# Patient Record
Sex: Male | Born: 1995 | Race: White | Hispanic: No | Marital: Single | Smoking: Never smoker
Health system: Southern US, Community
[De-identification: ages and names within clinical notes are randomized; demographics above are authoritative.]

## PROBLEM LIST (undated history)

## (undated) DIAGNOSIS — F329 Major depressive disorder, single episode, unspecified: Secondary | ICD-10-CM

## (undated) DIAGNOSIS — J45909 Unspecified asthma, uncomplicated: Secondary | ICD-10-CM

## (undated) DIAGNOSIS — R011 Cardiac murmur, unspecified: Secondary | ICD-10-CM

## (undated) DIAGNOSIS — F419 Anxiety disorder, unspecified: Secondary | ICD-10-CM

## (undated) DIAGNOSIS — F32A Depression, unspecified: Secondary | ICD-10-CM

## (undated) DIAGNOSIS — I38 Endocarditis, valve unspecified: Secondary | ICD-10-CM

## (undated) HISTORY — PX: OTHER SURGICAL HISTORY: SHX169

## (undated) HISTORY — DX: Anxiety disorder, unspecified: F41.9

## (undated) HISTORY — DX: Depression, unspecified: F32.A

## (undated) HISTORY — DX: Unspecified asthma, uncomplicated: J45.909

## (undated) HISTORY — DX: Major depressive disorder, single episode, unspecified: F32.9

---

## 2017-10-08 NOTE — Progress Notes (Signed)
10/09/2017 11:30 AM   Antonio KungAustin Warburton [birth year] [id number]  Referring provider: Armando GangLindley, Cheryl P, FNP [address] [place], [place] [id number]  Chief Complaint  Patient presents with  . Testicle Pain    HPI: Patient is a 22 year old Caucasian male who is referred by Armando Gangheryl P Lindley, FNP for a testicular lump and groin pain.  He has had a small hard lump on his left scrotum that he discovered in January.  It was not painful upon first discovery, but it is now painful.  He is having night sweats, 10 to 15 lbs weight loss in the last year or two.    He also states that intercourse can become painful at times.  He denies any pain with erections or curvature with erections.    He denies any trauma to the scrotum or penis.    He has been screened for DM and thyroid disease and they were normal.  His UA positive for moderate bacteria.    Scrotal ultrasound performed at PCP's office on 09/18/2017 noted no tumor, hydrocele or varicocele.    PMH: Past Medical History:  Diagnosis Date  . Anxiety   . Asthma   . Depression     Surgical History: Past Surgical History:  Procedure Laterality Date  . right ring finger      Home Medications:  Allergies as of 10/09/2017   No Known Allergies     Medication List    as of 10/09/2017 11:30 AM   You have not been prescribed any medications.     Allergies: No Known Allergies  Family History: Family History  Problem Relation Age of Onset  . Prostate cancer Paternal Grandfather     Social History:  reports that he has been smoking e-cigarettes. He has never used smokeless tobacco. His alcohol and drug histories are not on file.  ROS: UROLOGY Frequent Urination?: No Hard to postpone urination?: No Burning/pain with urination?: No Get up at night to urinate?: No Leakage of urine?: No Urine stream starts and stops?: No Trouble starting stream?: No Do you have to strain to urinate?: No Blood in urine?: No Urinary  tract infection?: No Sexually transmitted disease?: No Injury to kidneys or bladder?: No Painful intercourse?: Yes Weak stream?: No Erection problems?: No Penile pain?: Yes  Gastrointestinal Nausea?: Yes Vomiting?: No Indigestion/heartburn?: No Diarrhea?: Yes Constipation?: No  Constitutional Fever: No Night sweats?: Yes Weight loss?: Yes Fatigue?: Yes  Skin Skin rash/lesions?: No Itching?: No  Eyes Blurred vision?: No Double vision?: No  Ears/Nose/Throat Sore throat?: No Sinus problems?: No  Hematologic/Lymphatic Swollen glands?: No Easy bruising?: No  Cardiovascular Leg swelling?: No Chest pain?: No  Respiratory Cough?: No Shortness of breath?: No  Endocrine Excessive thirst?: No  Musculoskeletal Back pain?: No Joint pain?: No  Neurological Headaches?: No Dizziness?: No  Psychologic Depression?: No Anxiety?: Yes  Physical Exam: BP 120/78   Pulse 70   Ht 5\' 7"  (1.702 m)   Wt 155 lb 12.8 oz (70.7 kg)   BMI 24.40 kg/m   Constitutional:  Well nourished. Alert and oriented, No acute distress. HEENT: [place] AT, moist mucus membranes.  Trachea midline, no masses. Cardiovascular: No clubbing, cyanosis, or edema. Respiratory: Normal respiratory effort, no increased work of breathing. GI: Abdomen is soft, non tender, non distended, no abdominal masses. Liver and spleen not palpable.  No hernias appreciated.  Stool sample for occult testing is not indicated.   GU: No CVA tenderness.  No bladder fullness or masses.  Patient with circumcised phallus.  Urethral meatus is patent.  No penile discharge. No penile lesions or rashes. Scrotum without lesions, cysts, rashes and/or edema.  Testicles are located scrotally bilaterally. No masses are appreciated in the testicles. Left and right epididymis are normal.  The area of concern is located on the left in the upper scrotal cord.  It is 3 mm x 5 mm in size.   Rectal: Not performed.  Skin: No rashes, bruises or  suspicious lesions. Lymph: No cervical or inguinal adenopathy. Neurologic: Grossly intact, no focal deficits, moving all 4 extremities. Psychiatric: Normal mood and affect.  Laboratory Data: No results found for: WBC, HGB, HCT, MCV, PLT  No results found for: CREATININE  No results found for: PSA  No results found for: TESTOSTERONE  No results found for: HGBA1C  No results found for: TSH  No results found for: CHOL, HDL, CHOLHDL, VLDL, LDLCALC  No results found for: AST No results found for: ALT No components found for: ALKALINEPHOPHATASE No components found for: BILIRUBINTOTAL  No results found for: ESTRADIOL  Urinalysis No results found for: COLORURINE, APPEARANCEUR, LABSPEC, PHURINE, GLUCOSEU, HGBUR, BILIRUBINUR, KETONESUR, PROTEINUR, UROBILINOGEN, NITRITE, LEUKOCYTESUR  I have reviewed the labs.  Assessment & Plan:   1. Scrotal lump Patient will have a repeat scrotal ultrasound for further evaluation of the scrotal cord so that we may view the images on [facility] as the images cannot be reviewed from the PCP's office RTC for scrotal ultrasound report.    2. Weight loss Explained to the patient that the area in the scrotum is not likely the cause of his weight loss, advised to follow up with PCP  3. Night sweats See above   Return for scrotal ultrasound report .  These notes generated with voice recognition software. I apologize for typographical errors.  Michiel CowboySHANNON Jacobus Colvin, PA-C  [facility] [address]  [address] [place], [place] [address] [phone]

## 2017-10-09 ENCOUNTER — Encounter: Payer: Self-pay | Admitting: Urology

## 2017-10-09 ENCOUNTER — Ambulatory Visit (INDEPENDENT_AMBULATORY_CARE_PROVIDER_SITE_OTHER): Payer: 59 | Admitting: Urology

## 2017-10-09 ENCOUNTER — Other Ambulatory Visit: Payer: Self-pay

## 2017-10-09 VITALS — BP 120/78 | HR 70 | Ht 67.0 in | Wt 155.8 lb

## 2017-10-09 DIAGNOSIS — R61 Generalized hyperhidrosis: Secondary | ICD-10-CM | POA: Diagnosis not present

## 2017-10-09 DIAGNOSIS — R634 Abnormal weight loss: Secondary | ICD-10-CM

## 2017-10-09 DIAGNOSIS — N509 Disorder of male genital organs, unspecified: Secondary | ICD-10-CM | POA: Diagnosis not present

## 2017-10-09 DIAGNOSIS — N5089 Other specified disorders of the male genital organs: Secondary | ICD-10-CM

## 2017-10-09 LAB — MICROSCOPIC EXAMINATION
EPITHELIAL CELLS (NON RENAL): NONE SEEN /HPF (ref 0–10)
WBC, UA: NONE SEEN /hpf (ref 0–5)

## 2017-10-09 LAB — URINALYSIS, COMPLETE
BILIRUBIN UA: NEGATIVE
Glucose, UA: NEGATIVE
Ketones, UA: NEGATIVE
Leukocytes, UA: NEGATIVE
Nitrite, UA: NEGATIVE
PH UA: 6 (ref 5.0–7.5)
Specific Gravity, UA: >1.03 — ABNORMAL HIGH (ref 1.005–1.030)
Urobilinogen, Ur: 0.2 mg/dL (ref 0.2–1.0)

## 2017-10-11 LAB — CULTURE, URINE COMPREHENSIVE

## 2017-10-17 ENCOUNTER — Ambulatory Visit
Admission: RE | Admit: 2017-10-17 | Discharge: 2017-10-17 | Disposition: A | Discharge status: Home / Self Care | Payer: 59 | Source: Ambulatory Visit | Attending: Urology | Admitting: Urology

## 2017-10-17 DIAGNOSIS — N509 Disorder of male genital organs, unspecified: Secondary | ICD-10-CM | POA: Insufficient documentation

## 2017-10-17 DIAGNOSIS — N5089 Other specified disorders of the male genital organs: Secondary | ICD-10-CM

## 2017-10-23 ENCOUNTER — Telehealth: Payer: Self-pay | Admitting: Urology

## 2017-10-23 NOTE — Telephone Encounter (Signed)
Pt lmom stating that he had US last week and was told someone would call him with results last Friday, pt stated he hasn't received call from anyone, asking for someone to call with results. Please advise. Thanks.

## 2017-10-23 NOTE — Telephone Encounter (Signed)
Pt has appt scheduled for 10/30/17 to discuss u/s results

## 2017-10-30 ENCOUNTER — Ambulatory Visit (INDEPENDENT_AMBULATORY_CARE_PROVIDER_SITE_OTHER): Payer: 59 | Admitting: Urology

## 2017-10-30 ENCOUNTER — Other Ambulatory Visit: Payer: Self-pay

## 2017-10-30 ENCOUNTER — Encounter: Payer: Self-pay | Admitting: Urology

## 2017-10-30 VITALS — BP 123/67 | HR 91 | Ht 67.0 in | Wt 155.0 lb

## 2017-10-30 DIAGNOSIS — N361 Urethral diverticulum: Secondary | ICD-10-CM

## 2017-10-30 MED ORDER — DIAZEPAM 10 MG PO TABS: ORAL_TABLET | ORAL | 0 refills | Status: DC

## 2017-10-30 NOTE — Progress Notes (Signed)
10/30/2017 5:08 PM   Antonio Hayes 1995/08/10 [id number]  Referring provider: No referring provider defined for this encounter.  No chief complaint on file.   HPI: Patient is a 22 year old Caucasian male with a history of a testicular lump and groin pain who presents today for scrotal ultrasound results.  Background history Patient is a 22 year old Caucasian male who is referred by [name], FNP for a testicular lump and groin pain.  He has had a small hard lump on his left scrotum that he discovered in January.  It was not painful upon first discovery, but it is now painful.  He is having night sweats, 10 to 15 lbs weight loss in the last year or two.  He also states that intercourse can become painful at times.  He denies any pain with erections or curvature with erections.  He denies any trauma to the scrotum or penis.  He has been screened for DM and thyroid disease and they were normal.  His UA positive for moderate bacteria.   Scrotal ultrasound performed at PCP's office on 09/18/2017 noted no tumor, hydrocele or varicocele.    Since his scrotal ultrasound was performed at the PCPs office, I did not have access to the imaging so a repeat ultrasound was performed.  Scrotal ultrasound on 10/17/2017 revealed negative for testicular torsion or intratesticular mass lesion.  No cystic or solid mass to correspond to palpable, painful area located at the left lateral base of penis.  He is still having discomfort during sexual intercourse.  He is not having any urinary symptoms, specifically no spraying of the stream or straining to urinate.  Patient denies any gross hematuria, dysuria or suprapubic/flank pain.  Patient denies any fevers, chills, nausea or vomiting.     PMH: Past Medical History:  Diagnosis Date  . Anxiety   . Asthma   . Depression     Surgical History: Past Surgical History:  Procedure Laterality Date  . right ring finger      Home Medications:    Allergies as of 10/30/2017   No Known Allergies     Medication List        Accurate as of 10/30/17  5:08 PM. Always use your most recent med list.          diazepam 10 MG tablet Commonly known as:  VALIUM Take one tablet 30 minutes prior to the cystoscopy       Allergies: No Known Allergies  Family History: Family History  Problem Relation Age of Onset  . Prostate cancer Paternal Grandfather     Social History:  reports that he has been smoking e-cigarettes. He has never used smokeless tobacco. His alcohol and drug histories are not on file.  ROS: UROLOGY Frequent Urination?: No Hard to postpone urination?: No Burning/pain with urination?: No Get up at night to urinate?: No Leakage of urine?: No Urine stream starts and stops?: No Trouble starting stream?: No Do you have to strain to urinate?: No Blood in urine?: No Urinary tract infection?: No Sexually transmitted disease?: No Injury to kidneys or bladder?: No Painful intercourse?: Yes Weak stream?: No Erection problems?: No Penile pain?: No  Gastrointestinal Nausea?: Yes Vomiting?: No Indigestion/heartburn?: No Diarrhea?: Yes Constipation?: No  Constitutional Fever: No Night sweats?: No Weight loss?: No Fatigue?: No  Skin Skin rash/lesions?: No Itching?: No  Eyes Blurred vision?: No Double vision?: No  Ears/Nose/Throat Sore throat?: No Sinus problems?: No  Hematologic/Lymphatic Swollen glands?: No Easy bruising?:  No  Cardiovascular Leg swelling?: No Chest pain?: No  Respiratory Cough?: No Shortness of breath?: No  Endocrine Excessive thirst?: No  Musculoskeletal Back pain?: No Joint pain?: No  Neurological Headaches?: No Dizziness?: No  Psychologic Depression?: Yes Anxiety?: Yes  Physical Exam: BP 123/67   Pulse 91   Ht 5\' 7"  (1.702 m)   Wt 155 lb (70.3 kg)   BMI 24.28 kg/m   Constitutional:  Well nourished. Alert and oriented, No acute distress. HEENT: [place] AT,  moist mucus membranes.  Trachea midline, no masses. Cardiovascular: No clubbing, cyanosis, or edema. Respiratory: Normal respiratory effort, no increased work of breathing. GI: Abdomen is soft, non tender, non distended, no abdominal masses. Liver and spleen not palpable.  No hernias appreciated.  Stool sample for occult testing is not indicated.   GU: No CVA tenderness.  No bladder fullness or masses.  Patient with circumcised phallus.  Urethral meatus is patent.  No penile discharge. No penile lesions or rashes. Scrotum without lesions, cysts, rashes and/or edema.  Testicles are located scrotally bilaterally. No masses are appreciated in the testicles. Left and right epididymis are normal.  The area of concern is located on the ventral side left in the upper scrotal cord.  It is 5 mm x 7 mm in size, mobile but attached and polypoid in nature.   Rectal: Not performed.  Skin: No rashes, bruises or suspicious lesions. Lymph: No cervical or inguinal adenopathy. Neurologic: Grossly intact, no focal deficits, moving all 4 extremities. Psychiatric: Normal mood and affect.  Laboratory Data: No results found for: WBC, HGB, HCT, MCV, PLT  No results found for: CREATININE  No results found for: PSA  No results found for: TESTOSTERONE  No results found for: HGBA1C  No results found for: TSH  No results found for: CHOL, HDL, CHOLHDL, VLDL, LDLCALC  No results found for: AST No results found for: ALT No components found for: ALKALINEPHOPHATASE No components found for: BILIRUBINTOTAL  No results found for: ESTRADIOL  Urinalysis    Component Value Date/Time   APPEARANCEUR Clear 10/09/2017 1113   GLUCOSEU Negative 10/09/2017 1113   BILIRUBINUR Negative 10/09/2017 1113   PROTEINUR Trace (A) 10/09/2017 1113   NITRITE Negative 10/09/2017 1113   LEUKOCYTESUR Negative 10/09/2017 1113    I have reviewed the labs.  Pertinent imaging CLINICAL DATA:  Left testicle lump  EXAM: SCROTAL  ULTRASOUND  DOPPLER ULTRASOUND OF THE TESTICLES  TECHNIQUE: Complete ultrasound examination of the testicles, epididymis, and other scrotal structures was performed. Color and spectral Doppler ultrasound were also utilized to evaluate blood flow to the testicles.  COMPARISON:  None.  FINDINGS: Right testicle  Measurements: 5.7 x 2.5 x 3.7 cm. No mass or microlithiasis visualized.  Left testicle  Measurements: 5.4 x 2.1 x 3.5 cm. No mass or microlithiasis visualized.  Right epididymis:  Normal in size and appearance.  Left epididymis:  Normal in size and appearance.  Hydrocele:  None visualized.  Varicocele:  None visualized.  Pulsed Doppler interrogation of both testes demonstrates normal low resistance arterial and venous waveforms bilaterally.  IMPRESSION: 1. Negative for testicular torsion or intratesticular mass lesion. 2. No cystic or solid mass to correspond to palpable, painful area located at the left lateral base of penis.   Electronically Signed   By: Jasmine Pang M.D.   On: 10/18/2017 02:48  I have independently reviewed the films  Assessment & Plan:   1. ?Urethral diverticulum Patient examined with Dr. Apolinar Junes and Dr. Richardo Hanks.  Differential diagnose  of urethral diverticulum vs polypoid lesion.  He will be schedule for a cystoscopy and if the area cannot be identified, he will be scheduled for a biopsy of the lesion.  I have explained to the patient that they will  be scheduled for a cystoscopy in our office to evaluate their bladder.  The cystoscopy consists of passing a tube with a lens up through their urethra and into their urinary bladder.   We will inject the urethra with a lidocaine gel prior to introducing the cystoscope to help with any discomfort during the procedure.   After the procedure, they might experience blood in the urine and discomfort with urination.  This will abate after the first few voids.  I have  encouraged the  patient to increase water intake  during this time.  Patient denies any allergies to lidocaine.  He is given Valium to take prior to his procedure.  He is instructed to bring a responsible adult to drive him and to help him remember what will be dicussed after the cystoscopy.     Return for Cystoscopy with Dr. Apolinar Junes or Dr. Richardo Hanks .  These notes generated with voice recognition software. I apologize for typographical errors.  Michiel Cowboy, PA-C  [facility] [address]  [address] [place], [place] [address] [phone]

## 2017-11-06 ENCOUNTER — Other Ambulatory Visit: Payer: 59 | Admitting: Urology

## 2017-11-19 ENCOUNTER — Encounter: Payer: Self-pay | Admitting: Urology

## 2017-11-19 ENCOUNTER — Ambulatory Visit (INDEPENDENT_AMBULATORY_CARE_PROVIDER_SITE_OTHER): Payer: 59 | Admitting: Urology

## 2017-11-19 VITALS — BP 128/76 | HR 81 | Ht 67.0 in | Wt 155.4 lb

## 2017-11-19 DIAGNOSIS — N361 Urethral diverticulum: Secondary | ICD-10-CM

## 2017-11-19 NOTE — Progress Notes (Signed)
Cystoscopy Procedure Note:  Indication: Possible urethral lesion/diverticulum  On exam, I'm able to palpate a small ~205mm spherical structure just left of midline at ventral aspect of the peno-scrotal junction. This is freely mobile, and mildly tender.  After informed consent and discussion of the procedure and its risks, Antonio Hayes was positioned and prepped in the standard fashion. Cystoscopy was performed with a flexible cystoscope. The urethra was normal throughout with no ventral os or abnormality. The prostate was short. Cystoscopy grossly normal with no concerning mucosal lesions.  Findings: Normal cystoscopy  Assessment and Plan: Anticipate scheduling for scrotal exploration and excision of lesion with Dr. Apolinar Hayes in the OR  Antonio RamsBrian Sninsky, MD 11/19/2017

## 2017-11-20 LAB — URINALYSIS, COMPLETE
Bilirubin, UA: NEGATIVE
Glucose, UA: NEGATIVE
Ketones, UA: NEGATIVE
Leukocytes, UA: NEGATIVE
Nitrite, UA: NEGATIVE
PH UA: 6 (ref 5.0–7.5)
PROTEIN UA: NEGATIVE
Specific Gravity, UA: 1.02 (ref 1.005–1.030)
UUROB: 0.2 mg/dL (ref 0.2–1.0)

## 2017-11-20 LAB — MICROSCOPIC EXAMINATION
BACTERIA UA: NONE SEEN
WBC UA: NONE SEEN /HPF (ref 0–5)

## 2017-11-22 ENCOUNTER — Telehealth: Payer: Self-pay

## 2017-11-22 NOTE — Telephone Encounter (Signed)
Patient notified that surgery has been scheduled for 12-14-17. Patient states he needs to change this due to a scheduling conflict. Surgery was rescheduled to 12-28-17. Will call with PAT apt

## 2017-11-27 ENCOUNTER — Other Ambulatory Visit: Payer: Self-pay | Admitting: Radiology

## 2017-11-27 DIAGNOSIS — L729 Follicular cyst of the skin and subcutaneous tissue, unspecified: Secondary | ICD-10-CM

## 2017-12-21 ENCOUNTER — Inpatient Hospital Stay: Admission: RE | Admit: 2017-12-21 | Payer: 59 | Source: Ambulatory Visit

## 2017-12-26 ENCOUNTER — Other Ambulatory Visit: Payer: Self-pay

## 2017-12-26 ENCOUNTER — Encounter
Admission: RE | Admit: 2017-12-26 | Discharge: 2017-12-26 | Disposition: A | Discharge status: Home / Self Care | Payer: 59 | Source: Ambulatory Visit | Attending: Urology | Admitting: Urology

## 2017-12-26 HISTORY — DX: Endocarditis, valve unspecified: I38

## 2017-12-26 HISTORY — DX: Cardiac murmur, unspecified: R01.1

## 2017-12-26 NOTE — Patient Instructions (Signed)
Your procedure is scheduled on: 12-28-17  Report to Same Day Surgery 2nd floor medical mall [facility] Entrance-take elevator on left to 2nd floor.  Check in with surgery information desk.) To find out your arrival time please call [phone] between 1PM - 3PM on 12-26-17   Remember: Instructions that are not followed completely may result in serious medical risk, up to and including death, or upon the discretion of your surgeon and anesthesiologist your surgery may need to be rescheduled.    _x___ 1. Do not eat food after midnight the night before your procedure. You may drink clear liquids up to 2 hours before you are scheduled to arrive at the hospital for your procedure.  Do not drink clear liquids within 2 hours of your scheduled arrival to the hospital.  Clear liquids include  --Water or Apple juice without pulp  --Clear carbohydrate beverage such as ClearFast or Gatorade  --Black Coffee or Clear Tea (No milk, no creamers, do not add anything to the coffee or Tea   ____Ensure clear carbohydrate drink on the way to the hospital for bariatric patients  ____Ensure clear carbohydrate drink 3 hours before surgery for Dr Rutherford Nail patients if physician instructed.   No gum chewing or hard candies.     __x__ 2. No Alcohol for 24 hours before or after surgery.   __x__3. No Smoking or e-cigarettes for 24 prior to surgery.  Do not use any chewable tobacco products for at least 6 hour prior to surgery   ____  4. Bring all medications with you on the day of surgery if instructed.    __x__ 5. Notify your doctor if there is any change in your medical condition     (cold, fever, infections).    x___6. On the morning of surgery brush your teeth with toothpaste and water.  You may rinse your mouth with mouth wash if you wish.  Do not swallow any toothpaste or mouthwash.   Do not wear jewelry, make-up, hairpins, clips or nail polish.  Do not wear lotions, powders, or perfumes. You may wear  deodorant.  Do not shave 48 hours prior to surgery. Men may shave face and neck.  Do not bring valuables to the hospital.    [facility] is not responsible for any belongings or valuables.               Contacts, dentures or bridgework may not be worn into surgery.  Leave your suitcase in the car. After surgery it may be brought to your room.  For patients admitted to the hospital, discharge time is determined by your  treatment team.  _  Patients discharged the day of surgery will not be allowed to drive home.  You will need someone to drive you home and stay with you the night of your procedure.    Please read over the following fact sheets that you were given:   [facility] Preparing for Surgery   ____ Take anti-hypertensive listed below, cardiac, seizure, asthma, anti-reflux and psychiatric medicines. These include:  1. NONE  2.  3.  4.  5.  6.  ____Fleets enema or Magnesium Citrate as directed.   ____ Use CHG Soap or sage wipes as directed on instruction sheet   ____ Use inhalers on the day of surgery and bring to hospital day of surgery  ____ Stop Metformin and Janumet 2 days prior to surgery.    ____ Take 1/2 of usual insulin dose the night before surgery  and none on the morning surgery.   ____ Follow recommendations from Cardiologist, Pulmonologist or PCP regarding  stopping Aspirin, Coumadin, Plavix ,Eliquis, Effient, or Pradaxa, and Pletal.  X____Stop Anti-inflammatories such as Advil, Aleve, Ibuprofen, Motrin, Naproxen, Naprosyn, Goodies powders or aspirin products NOW-OK to take Tylenol    ____ Stop supplements until after surgery.   ____ Bring C-Pap to the hospital.

## 2017-12-27 MED ORDER — CEFAZOLIN SODIUM-DEXTROSE 1-4 GM/50ML-% IV SOLN
1.0000 g | INTRAVENOUS | Status: AC
  Administered 2017-12-28: 1 g via INTRAVENOUS

## 2017-12-28 ENCOUNTER — Ambulatory Visit: Payer: Commercial Managed Care - HMO | Admitting: Certified Registered Nurse Anesthetist

## 2017-12-28 ENCOUNTER — Encounter
Admission: RE | Disposition: A | Discharge status: Home / Self Care | Payer: Self-pay | Source: Ambulatory Visit | Attending: Urology

## 2017-12-28 ENCOUNTER — Ambulatory Visit
Admission: RE | Admit: 2017-12-28 | Discharge: 2017-12-28 | Disposition: A | Discharge status: Home / Self Care | Payer: Commercial Managed Care - HMO | Source: Ambulatory Visit | Attending: Urology | Admitting: Urology

## 2017-12-28 ENCOUNTER — Telehealth: Payer: Self-pay | Admitting: Radiology

## 2017-12-28 DIAGNOSIS — Z885 Allergy status to narcotic agent status: Secondary | ICD-10-CM | POA: Insufficient documentation

## 2017-12-28 DIAGNOSIS — L729 Follicular cyst of the skin and subcutaneous tissue, unspecified: Secondary | ICD-10-CM | POA: Insufficient documentation

## 2017-12-28 DIAGNOSIS — J45909 Unspecified asthma, uncomplicated: Secondary | ICD-10-CM | POA: Diagnosis not present

## 2017-12-28 HISTORY — PX: SCROTAL EXPLORATION: SHX2386

## 2017-12-28 HISTORY — PX: URETHRAL CYST REMOVAL: SHX5128

## 2017-12-28 LAB — URINE DRUG SCREEN, QUALITATIVE (ARMC ONLY)
AMPHETAMINES, UR SCREEN: NOT DETECTED
BENZODIAZEPINE, UR SCRN: NOT DETECTED
Barbiturates, Ur Screen: NOT DETECTED
CANNABINOID 50 NG, UR ~~LOC~~: POSITIVE — AB
Cocaine Metabolite,Ur ~~LOC~~: NOT DETECTED
MDMA (Ecstasy)Ur Screen: NOT DETECTED
Methadone Scn, Ur: NOT DETECTED
OPIATE, UR SCREEN: NOT DETECTED
PHENCYCLIDINE (PCP) UR S: NOT DETECTED
Tricyclic, Ur Screen: NOT DETECTED

## 2017-12-28 SURGERY — EXPLORATION, SCROTUM
Anesthesia: General

## 2017-12-28 MED ORDER — PROPOFOL 10 MG/ML IV BOLUS
INTRAVENOUS | Status: DC | PRN
  Administered 2017-12-28: 200 mg via INTRAVENOUS

## 2017-12-28 MED ORDER — OXYCODONE HCL 5 MG PO TABS
ORAL_TABLET | ORAL | Status: AC
  Filled 2017-12-28: qty 1

## 2017-12-28 MED ORDER — ONDANSETRON HCL 4 MG/2ML IJ SOLN
INTRAMUSCULAR | Status: DC | PRN
  Administered 2017-12-28: 4 mg via INTRAVENOUS

## 2017-12-28 MED ORDER — PHENYLEPHRINE HCL 10 MG/ML IJ SOLN
INTRAMUSCULAR | Status: DC | PRN
  Administered 2017-12-28: 100 ug via INTRAVENOUS

## 2017-12-28 MED ORDER — FENTANYL CITRATE (PF) 100 MCG/2ML IJ SOLN
INTRAMUSCULAR | Status: AC
  Filled 2017-12-28: qty 2

## 2017-12-28 MED ORDER — LIDOCAINE HCL (PF) 1 % IJ SOLN
INTRAMUSCULAR | Status: AC
  Filled 2017-12-28: qty 30

## 2017-12-28 MED ORDER — LACTATED RINGERS IV SOLN
INTRAVENOUS | Status: DC
  Administered 2017-12-28 (×2): via INTRAVENOUS

## 2017-12-28 MED ORDER — MIDAZOLAM HCL 2 MG/2ML IJ SOLN
INTRAMUSCULAR | Status: AC
  Filled 2017-12-28: qty 2

## 2017-12-28 MED ORDER — ACETAMINOPHEN 500 MG PO TABS: 1000.0000 mg | ORAL_TABLET | Freq: Four times a day (QID) | ORAL | 2 refills | Status: AC | PRN

## 2017-12-28 MED ORDER — BUPIVACAINE HCL (PF) 0.5 % IJ SOLN
INTRAMUSCULAR | Status: AC
  Filled 2017-12-28: qty 30

## 2017-12-28 MED ORDER — FENTANYL CITRATE (PF) 100 MCG/2ML IJ SOLN
INTRAMUSCULAR | Status: DC | PRN
  Administered 2017-12-28: 100 ug via INTRAVENOUS

## 2017-12-28 MED ORDER — DEXAMETHASONE SODIUM PHOSPHATE 10 MG/ML IJ SOLN
INTRAMUSCULAR | Status: DC | PRN
  Administered 2017-12-28: 10 mg via INTRAVENOUS

## 2017-12-28 MED ORDER — PROPOFOL 10 MG/ML IV BOLUS
INTRAVENOUS | Status: AC
  Filled 2017-12-28: qty 20

## 2017-12-28 MED ORDER — HYDROMORPHONE HCL 1 MG/ML IJ SOLN: 0.2500 mg | INTRAMUSCULAR | Status: DC | PRN

## 2017-12-28 MED ORDER — FAMOTIDINE 20 MG PO TABS
20.0000 mg | ORAL_TABLET | Freq: Once | ORAL | Status: AC
  Administered 2017-12-28: 20 mg via ORAL

## 2017-12-28 MED ORDER — OXYCODONE HCL 5 MG PO TABS: 5.0000 mg | ORAL_TABLET | Freq: Three times a day (TID) | ORAL | 0 refills | Status: AC | PRN

## 2017-12-28 MED ORDER — MIDAZOLAM HCL 2 MG/2ML IJ SOLN
INTRAMUSCULAR | Status: DC | PRN
  Administered 2017-12-28: 2 mg via INTRAVENOUS

## 2017-12-28 MED ORDER — BACITRACIN ZINC 500 UNIT/GM EX OINT
TOPICAL_OINTMENT | CUTANEOUS | Status: AC
  Filled 2017-12-28: qty 28.35

## 2017-12-28 MED ORDER — FAMOTIDINE 20 MG PO TABS
ORAL_TABLET | ORAL | Status: AC
  Filled 2017-12-28: qty 1

## 2017-12-28 MED ORDER — BUPIVACAINE HCL 0.5 % IJ SOLN
INTRAMUSCULAR | Status: DC | PRN
  Administered 2017-12-28: 8 mL

## 2017-12-28 MED ORDER — CEFAZOLIN SODIUM-DEXTROSE 1-4 GM/50ML-% IV SOLN
INTRAVENOUS | Status: AC
  Filled 2017-12-28: qty 50

## 2017-12-28 MED ORDER — OXYCODONE HCL 5 MG PO TABS
5.0000 mg | ORAL_TABLET | Freq: Once | ORAL | Status: AC
  Administered 2017-12-28: 5 mg via ORAL

## 2017-12-28 SURGICAL SUPPLY — 37 items
BLADE CLIPPER SURG (BLADE) ×2 IMPLANT
BLADE SURG 15 STRL LF DISP TIS (BLADE) ×1 IMPLANT
BLADE SURG 15 STRL SS (BLADE) ×1
CANISTER SUCT 1200ML W/VALVE (MISCELLANEOUS) ×2 IMPLANT
CHLORAPREP W/TINT 26ML (MISCELLANEOUS) ×2 IMPLANT
COVER WAND RF STERILE (DRAPES) ×2 IMPLANT
DERMABOND ADVANCED (GAUZE/BANDAGES/DRESSINGS) ×1
DERMABOND ADVANCED .7 DNX12 (GAUZE/BANDAGES/DRESSINGS) ×1 IMPLANT
DRAIN PENROSE 1/4X12 LTX (DRAIN) IMPLANT
DRAPE LAPAROTOMY 77X122 PED (DRAPES) ×2 IMPLANT
DRSG GAUZE FLUFF 36X18 (GAUZE/BANDAGES/DRESSINGS) ×2 IMPLANT
ELECT REM PT RETURN 9FT ADLT (ELECTROSURGICAL) ×2
ELECTRODE REM PT RTRN 9FT ADLT (ELECTROSURGICAL) ×1 IMPLANT
GAUZE SPONGE 4X4 12PLY STRL (GAUZE/BANDAGES/DRESSINGS) IMPLANT
GLOVE BIOGEL PI IND STRL 7.5 (GLOVE) ×1 IMPLANT
GLOVE BIOGEL PI INDICATOR 7.5 (GLOVE) ×1
GOWN STRL REUS W/ TWL LRG LVL3 (GOWN DISPOSABLE) ×1 IMPLANT
GOWN STRL REUS W/ TWL XL LVL3 (GOWN DISPOSABLE) ×1 IMPLANT
GOWN STRL REUS W/TWL LRG LVL3 (GOWN DISPOSABLE) ×1
GOWN STRL REUS W/TWL XL LVL3 (GOWN DISPOSABLE) ×1
KIT TURNOVER KIT A (KITS) ×2 IMPLANT
LABEL OR SOLS (LABEL) ×2 IMPLANT
NEEDLE HYPO 25X1 1.5 SAFETY (NEEDLE) ×2 IMPLANT
NS IRRIG 500ML POUR BTL (IV SOLUTION) ×2 IMPLANT
PACK BASIN MINOR ARMC (MISCELLANEOUS) ×2 IMPLANT
SUPPORETR ATHLETIC LG (MISCELLANEOUS) ×1 IMPLANT
SUPPORTER ATHLETIC LG (MISCELLANEOUS) ×2
SUT CHROMIC 3 0 PS 2 (SUTURE) ×4 IMPLANT
SUT CHROMIC 3 0 SH 27 (SUTURE) IMPLANT
SUT ETHILON 3-0 FS-10 30 BLK (SUTURE)
SUT ETHILON NAB PS2 4-0 18IN (SUTURE) ×2 IMPLANT
SUT VIC AB 3-0 SH 27 (SUTURE) ×2
SUT VIC AB 3-0 SH 27X BRD (SUTURE) ×2 IMPLANT
SUT VIC AB 4-0 SH 27 (SUTURE) ×1
SUT VIC AB 4-0 SH 27XANBCTRL (SUTURE) ×1 IMPLANT
SUTURE EHLN 3-0 FS-10 30 BLK (SUTURE) IMPLANT
SYR 10ML LL (SYRINGE) ×2 IMPLANT

## 2017-12-28 NOTE — Anesthesia Post-op Follow-up Note (Signed)
Anesthesia QCDR form completed.        

## 2017-12-28 NOTE — Discharge Instructions (Signed)

## 2017-12-28 NOTE — H&P (Signed)
   10:25 AM   Mervin Kung 05-24-1995 [id number]  CC: Scrotal cyst  HPI: Mr. Shapley is a 22 year old healthy male here today for excision of a small scrotal cyst.  Briefly is a very healthy 22 year old male with a very small less than half a centimeter tender circular cyst in the midline of the scrotum at the base of the penis.  This was unable to be visualized on ultrasound.  On cystoscopy this did not appear to communicate with the urethra.  He would like to proceed with excision of this small cyst.    PMH: Past Medical History:  Diagnosis Date  . Anxiety    NO MEDS  . Asthma   . Depression    NO MEDS  . Heart murmur   . Leaky heart valve    ASYMPTOMATIC    Surgical History: Past Surgical History:  Procedure Laterality Date  . right ring finger      Allergies:  Allergies  Allergen Reactions  . Percocet [Oxycodone-Acetaminophen] Other (See Comments)    PT STATES IT PUTS HIM IN A DEPRESSION    Family History: Family History  Problem Relation Age of Onset  . Prostate cancer Paternal Grandfather     Social History:  reports that he has never smoked. He has never used smokeless tobacco. He reports that he drinks alcohol. He reports that he has current or past drug history. Drug: Marijuana.  ROS: Please see flowsheet from today's date for complete review of systems.  Physical Exam: BP 117/68   Pulse 61   Temp 98.1 F (36.7 C) (Temporal)   Resp 18   Ht 5\' 7"  (1.702 m)   Wt 72.6 kg   SpO2 99%   BMI 25.06 kg/m    Constitutional:  Alert and oriented, No acute distress. Cardiovascular: Regular rate and rhythm Respiratory: Clear to auscultation bilaterally GI: Abdomen is soft, nontender, nondistended, no abdominal masses GU: Testicles descended and 20 cc bilaterally, no testicular masses.  There is a small less than half a centimeter mobile minimally tender cyst in the midline at the base of the penis. Lymph: No cervical or inguinal lymphadenopathy. Skin:  No rashes, bruises or suspicious lesions. Neurologic: Grossly intact, no focal deficits, moving all 4 extremities. Psychiatric: Normal mood and affect.  Laboratory Data: None to review**  Pertinent Imaging: I have personally reviewed the scrotal ultrasound dated 10/18/2017, unable to visualize the scrotal cystic or solid mass.  No testicular masses  Assessment & Plan:   In summary, Mr. Ples Specter is a healthy 22 year old male with a tender very small cystic lesion in the scrotum that is unable to be visualized on ultrasound.  All signs point to this being a benign lesion, however he feels like it is tender and bothersome and he would like it excised.  We discussed the low possibility that this communicates with the urethra.  We discussed the risks including bleeding, infection, need for additional procedures, possibility of malignancy, and need for scrotal support, icing, and rest over the next week.   Sondra Come, MD  [facility] [address] [place], [place] [address] [phone]

## 2017-12-28 NOTE — Anesthesia Preprocedure Evaluation (Addendum)
Anesthesia Evaluation  Patient identified by MRN, date of birth, ID band Patient awake    Reviewed: Allergy & Precautions, H&P , NPO status , Patient's Chart, lab work & pertinent test results  Airway Mallampati: I  TM Distance: >3 FB Neck ROM: full    Dental  (+) Teeth Intact   Pulmonary asthma ,    breath sounds clear to auscultation       Cardiovascular Exercise Tolerance: Good + Valvular Problems/Murmurs (pt was told he had a "leaky valve that was nothing to worry about" several years ago, no echo record available, asymptomatic)  Rhythm:regular Rate:Normal - Systolic murmurs Vigorously exercises without issues   Neuro/Psych PSYCHIATRIC DISORDERS Anxiety Depression negative neurological ROS     GI/Hepatic negative GI ROS, (+)     substance abuse  marijuana use,   Endo/Other  negative endocrine ROS  Renal/GU      Musculoskeletal   Abdominal   Peds  Hematology negative hematology ROS (+)   Anesthesia Other Findings Past Medical History: No date: Anxiety     Comment:  NO MEDS No date: Asthma No date: Depression     Comment:  NO MEDS No date: Heart murmur No date: Leaky heart valve     Comment:  ASYMPTOMATIC  Past Surgical History: No date: right ring finger     Reproductive/Obstetrics negative OB ROS                           Anesthesia Physical Anesthesia Plan  ASA: II  Anesthesia Plan: General LMA   Post-op Pain Management:    Induction:   PONV Risk Score and Plan: Ondansetron and Dexamethasone  Airway Management Planned:   Additional Equipment:   Intra-op Plan:   Post-operative Plan:   Informed Consent: I have reviewed the patients History and Physical, chart, labs and discussed the procedure including the risks, benefits and alternatives for the proposed anesthesia with the patient or authorized representative who has indicated his/her understanding and  acceptance.   Dental Advisory Given  Plan Discussed with: Anesthesiologist, CRNA and Surgeon  Anesthesia Plan Comments:       Anesthesia Quick Evaluation

## 2017-12-28 NOTE — Anesthesia Procedure Notes (Signed)
Procedure Name: LMA Insertion Date/Time: 12/28/2017 10:54 AM Performed by: Junious Silk, CRNA Pre-anesthesia Checklist: Patient identified, Patient being monitored, Timeout performed, Emergency Drugs available and Suction available Patient Re-evaluated:Patient Re-evaluated prior to induction Oxygen Delivery Method: Circle system utilized Preoxygenation: Pre-oxygenation with 100% oxygen Induction Type: IV induction Ventilation: Mask ventilation without difficulty LMA: LMA inserted LMA Size: 4.0 Tube type: Oral Number of attempts: 1 Placement Confirmation: positive ETCO2 and breath sounds checked- equal and bilateral Tube secured with: Tape Dental Injury: Teeth and Oropharynx as per pre-operative assessment

## 2017-12-28 NOTE — Op Note (Signed)
Date of procedure: 12/28/17  Preoperative diagnosis:  1. Scrotal cyst, <1cm  Postoperative diagnosis:  1. Same  Procedure: 1. Scrotal expiration, excision of scrotal cyst  Surgeon: Legrand Rams, MD  Anesthesia: General  Complications: None  Intraoperative findings:  1.  Very small, approximately 5 mm scrotal cyst just left of the midline at the base of the penis.   EBL: 5 cc  Specimens: Scrotal cyst  Drains: None  Indication: Antonio Hayes is a 22 y.o. patient with a small 5 mm mobile and minimally tender scrotal cyst at the base of the penis.  This is very bothersome to the patient and he desired removal.  It was unable to be imaged on ultrasound of the scrotum.  After reviewing the management options for treatment, they elected to proceed with the above surgical procedure(s). We have discussed the potential benefits and risks of the procedure, side effects of the proposed treatment, the likelihood of the patient achieving the goals of the procedure, and any potential problems that might occur during the procedure or recuperation. Informed consent has been obtained.  Description of procedure:  The patient was taken to the operating room and general anesthesia was induced.  The patient was placed in the supine position, prepped and draped in the usual sterile fashion, and preoperative antibiotics(cefazolin) were administered. A preoperative time-out was performed.   We first started by performing a very thorough exam.  There are no masses on either testicle.  I was able to palpate a mobile 5 mm cystlike structure just left of the midline at the base of the penis.  I attempted to bring this up through a small lateral vasectomy style incision that was made using the vasectomy instrument.  I was unable to fully pull this up to the incision and I felt the midline approach would be safer.  The lateral incision was closed with a single 3-0 chromic interrupted stitch.  I then made a 3 cm  incision along the median raphae at the base of the penis and bluntly dissected between the base of the penis and the scrotum.  I was able to pull up this small cyst and this was grasped with a hemostat.  Electrocautery was used to dissect the cyst free from the surrounding tissues.  It did not appear to be intimately involved with any other structures.  The cyst was white and appeared benign.  It was 5 mm in size, and sent for permanent pathology.  The wound was copiously irrigated with normal saline.  We carefully examined the wound and achieved excellent hemostasis.  The dartos was then closed with a running 2-0 Vicryl.  The skin was closed with interrupted 3-0 chromic stitches.  A total of 9 cc of half percent Marcaine without epinephrine were injected alongside the incision.  Telfa was applied with scrotal fluffs and snug fitting mesh pants.  Disposition: Stable to PACU  Plan: We will call with pathology Follow-up in 1 month for wound check  Legrand Rams, MD

## 2017-12-28 NOTE — Telephone Encounter (Signed)
[facility] notifying patient of follow up appointment with Dr Richardo Hanks.

## 2017-12-28 NOTE — Telephone Encounter (Signed)
-----   Message from Sondra Come, MD sent at 12/28/2017 12:51 PM EDT ----- Regarding: follow up Please schedule 15 minute follow up with me in 4-5 weeks for post op follow up.  Legrand Rams, MD 12/28/2017

## 2017-12-28 NOTE — Transfer of Care (Signed)
Immediate Anesthesia Transfer of Care Note  Patient: Antonio Hayes  Procedure(s) Performed: SCROTUM EXPLORATION (N/A ) EXCISION SCROTAL CYST (N/A )  Patient Location: PACU  Anesthesia Type:General  Level of Consciousness: sedated  Airway & Oxygen Therapy: Patient Spontanous Breathing and Patient connected to face mask oxygen  Post-op Assessment: Report given to RN and Post -op Vital signs reviewed and stable  Post vital signs: Reviewed and stable  Last Vitals:  Vitals Value Taken Time  BP    Temp    Pulse 44 12/28/2017 11:36 AM  Resp    SpO2 100 % 12/28/2017 11:36 AM  Vitals shown include unvalidated device data.  Last Pain:  Vitals:   12/28/17 0913  TempSrc: Temporal         Complications: No apparent anesthesia complications

## 2017-12-28 NOTE — OR Nursing (Signed)
Discharge instructions discussed with pt and fiancee. Both voice understanding.

## 2017-12-31 ENCOUNTER — Encounter: Payer: Self-pay | Admitting: Urology

## 2017-12-31 LAB — SURGICAL PATHOLOGY

## 2018-01-01 NOTE — Anesthesia Postprocedure Evaluation (Signed)
Anesthesia Post Note  Patient: Antonio Hayes  Procedure(s) Performed: SCROTUM EXPLORATION (N/A ) EXCISION SCROTAL CYST (N/A )  Patient location during evaluation: PACU Anesthesia Type: General Level of consciousness: awake and alert Pain management: pain level controlled Vital Signs Assessment: post-procedure vital signs reviewed and stable Respiratory status: spontaneous breathing, nonlabored ventilation and respiratory function stable Cardiovascular status: blood pressure returned to baseline and stable Postop Assessment: no apparent nausea or vomiting Anesthetic complications: no     Last Vitals:  Vitals:   12/28/17 1220 12/28/17 1244  BP: 133/74 120/72  Pulse: (!) 59   Resp: 18   Temp: (!) 36.2 C   SpO2: 100%     Last Pain:  Vitals:   12/28/17 1220  TempSrc:   PainSc: 3                  Jovita Gamma

## 2018-01-02 ENCOUNTER — Telehealth: Payer: Self-pay

## 2018-01-02 NOTE — Telephone Encounter (Signed)
Called pt informed him of the information below. Pt gave verbal understanding.  

## 2018-01-02 NOTE — Telephone Encounter (Signed)
-----   Message from Sondra Come, MD sent at 01/01/2018 10:14 AM EST ----- Please let him know the lesion we removed from his scrotum was benign.  It showed a simple cyst with calcification.  This is good news.  This is not any type of tumor or cancer.  He should keep his scheduled follow-up with me for a postop check in 4 to 6 weeks.  Legrand Rams, MD 01/01/2018

## 2018-01-17 ENCOUNTER — Telehealth: Payer: Self-pay | Admitting: Urology

## 2018-01-17 NOTE — Telephone Encounter (Signed)
Pt lmom stating that he is 3 weeks post op and is having recovery issues, pt states he would like someone to call him as soon as possible. Please advise. Thank you.

## 2018-01-18 NOTE — Telephone Encounter (Signed)
Called patient no answer mailbox full

## 2018-01-30 ENCOUNTER — Ambulatory Visit (INDEPENDENT_AMBULATORY_CARE_PROVIDER_SITE_OTHER): Payer: 59 | Admitting: Urology

## 2018-01-30 ENCOUNTER — Encounter: Payer: Self-pay | Admitting: Urology

## 2018-01-30 VITALS — BP 126/84 | HR 66 | Ht 67.0 in | Wt 157.1 lb

## 2018-01-30 DIAGNOSIS — N509 Disorder of male genital organs, unspecified: Secondary | ICD-10-CM

## 2018-01-30 NOTE — Progress Notes (Signed)
   01/30/2018 9:58 AM   Antonio Hayes 1995/08/05 [id number]  Reason for visit: Follow up excision of scrotal lesion   HPI: I saw Antonio Hayes back in urology clinic after excision of a scrotal cyst.  On 12/28/2017 he underwent excision of a small 5mm painful midline scrotal lesion through a small midline incision.  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  He is doing well and denies any scrotal pain at this time.  He does however notice a new rash on the left side of the scrotum that has had over the last few days.  He states it is itchy but not particularly painful.  He denies any fevers or chills.  Denies any new soaps or medications.   Physical Exam: BP 126/84   Pulse 66   Ht 5\' 7"  (1.702 m)   Wt 157 lb 1.6 oz (71.3 kg)   BMI 24.61 kg/m    Constitutional:  Alert and oriented, No acute distress. Respiratory: Normal respiratory effort, no increased work of breathing. GI: Abdomen is soft, nontender, nondistended, no abdominal masses GU: Phallus without lesions.  Well-healed midline scrotal incision with no erythema or drainage.  Subtle, faint erythematous rash on the left side of the scrotum with some flaking of the skin.  No purulence or signs of cellulitis.  Assessment & Plan:   In summary, Antonio Hayes is a 22 year old healthy male who underwent excision of a benign scrotal lesion 12/28/2017 without complication.  Pathology was benign showing a dystrophic calcification.  He does have a new subtle rash on the left side of the scrotum over the last few days.  On exam this is more consistent with dry skin than cellulitis.  I recommended a moisturizing lotion for the next few days, and to call if it worsens to consider a short course of antibiotics.  Follow-up as needed  A total of 10 minutes were spent face-to-face with the patient, greater than 50% was spent in patient education, counseling, and coordination of care regarding postop care  after scrotal cyst excision, and scrotal rash.   Sondra ComeBrian C Inessa Wardrop, MD  [facility] [address], [address] [place], [place] [address] [phone]

## 2018-11-04 IMAGING — US US SCROTUM W/ DOPPLER COMPLETE
1 series · 14 of 25 positions shown · non-contrast
Comparison: None.

CLINICAL DATA: Left testicle lump

EXAM:
SCROTAL ULTRASOUND
DOPPLER ULTRASOUND OF THE TESTICLES
TECHNIQUE: Complete ultrasound examination of the testicles, epididymis, and
other scrotal structures was performed. Color and spectral Doppler
ultrasound were also utilized to evaluate blood flow to the
testicles.

[Series 1: us scrotum w/ doppler complete · 0.08mm/px · 68 acquisitions, 14 frames shown]
[im 1/68]
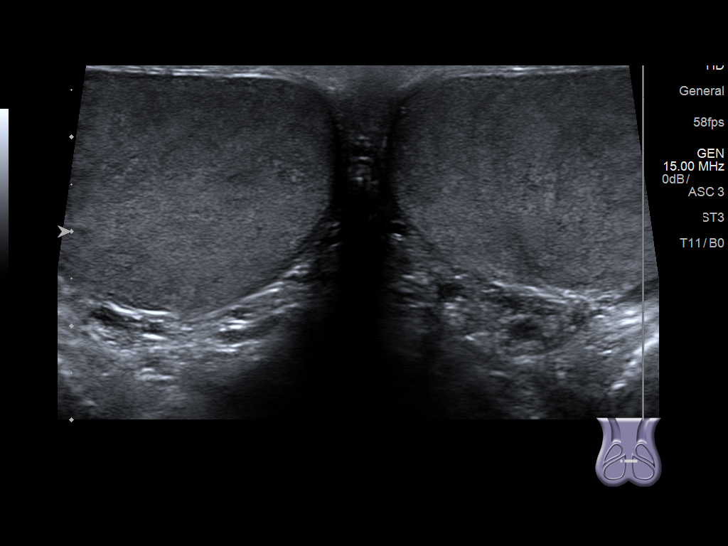
[im 6/68]
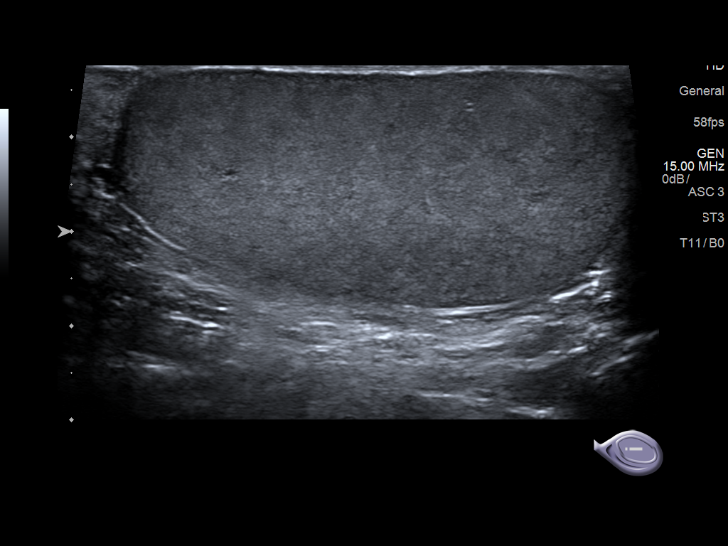
[im 12/68]
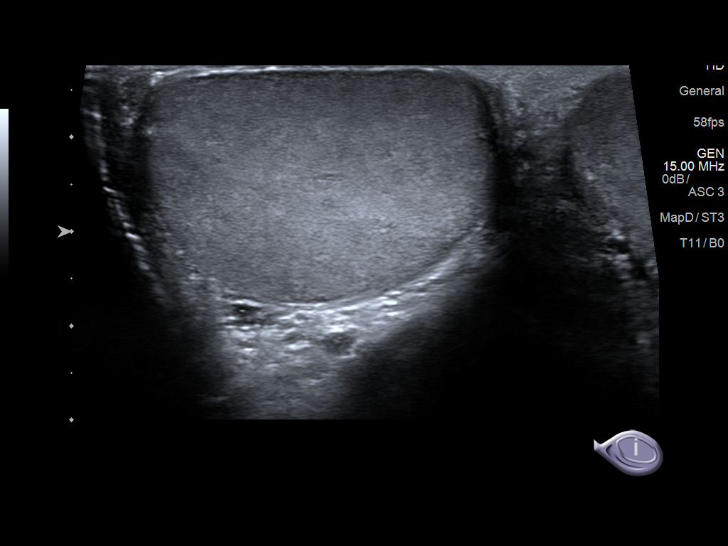
[im 17/68]
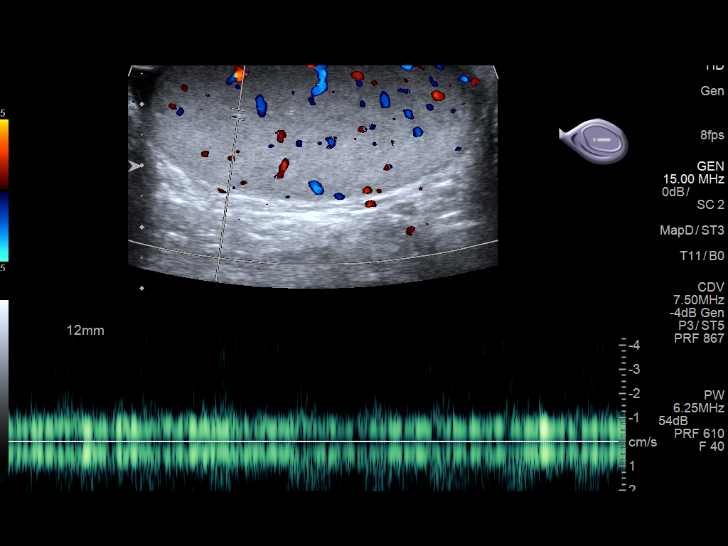
[im 23/68]
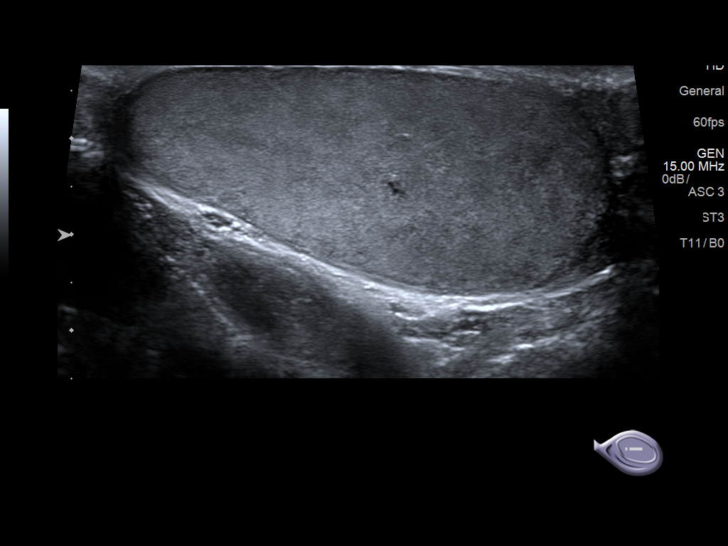
[im 26/68]
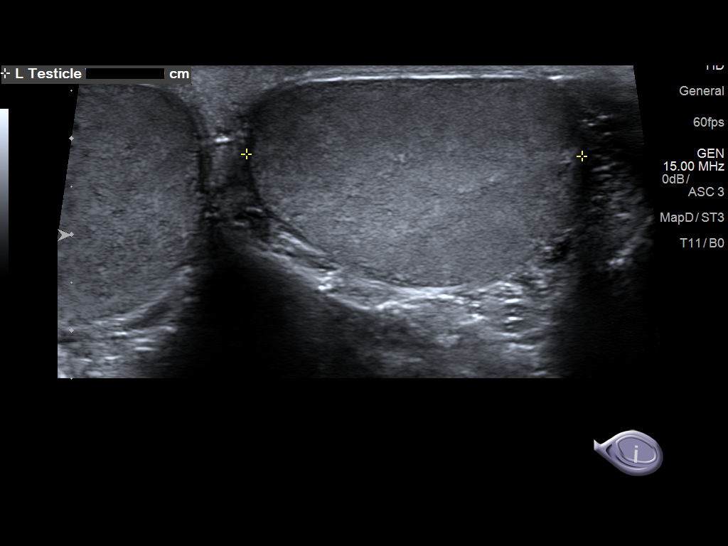
[im 31/68]
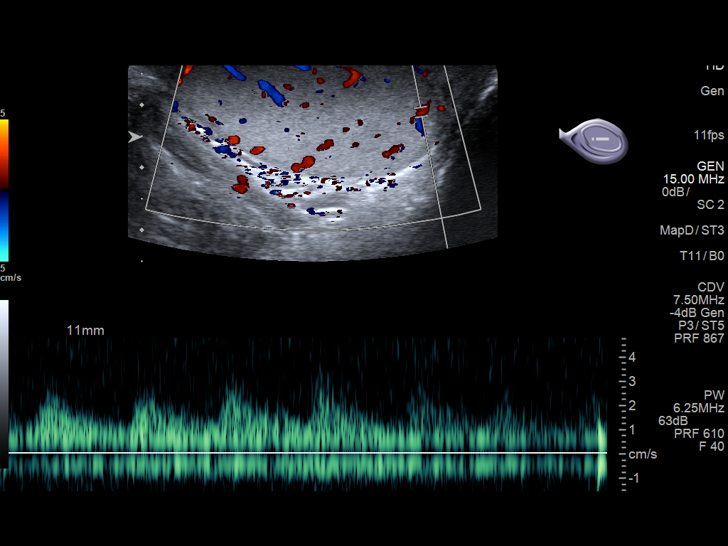
[im 37/68]
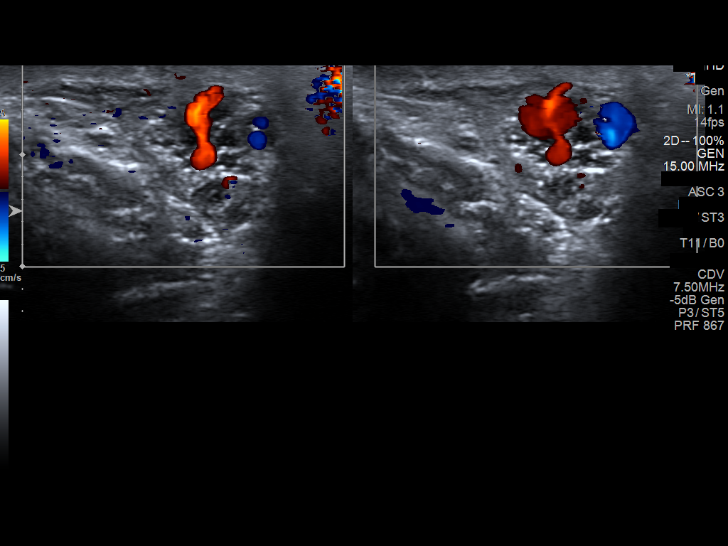
[im 42/68]
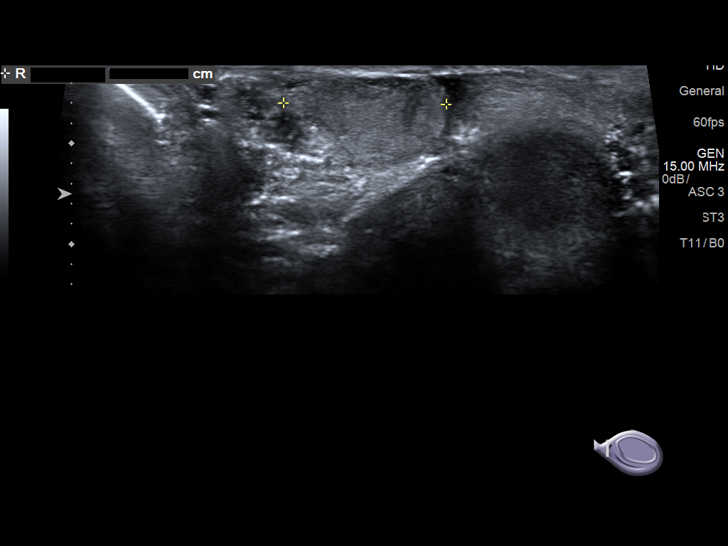
[im 45/68]
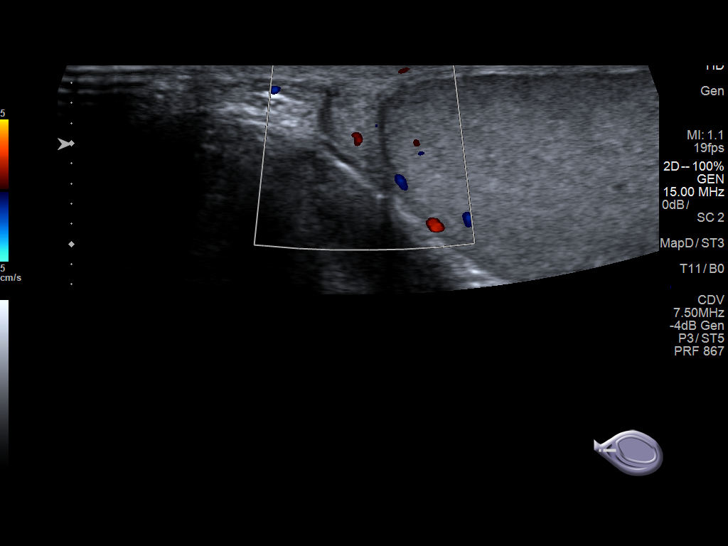
[im 51/68]
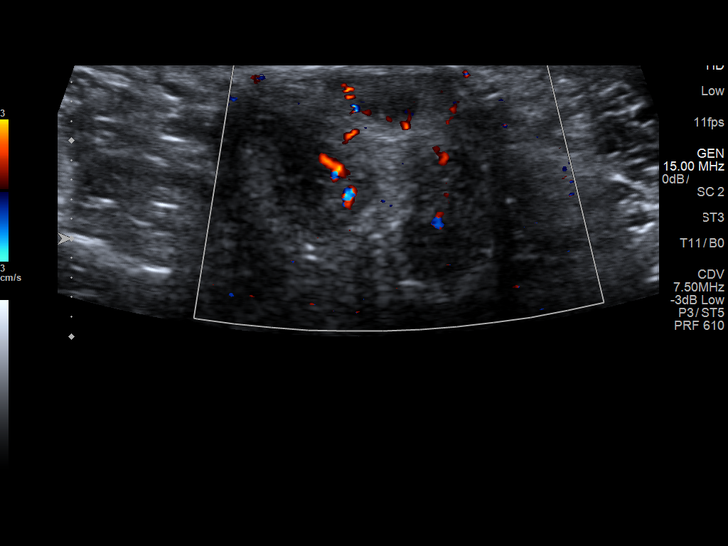
[im 56/68]
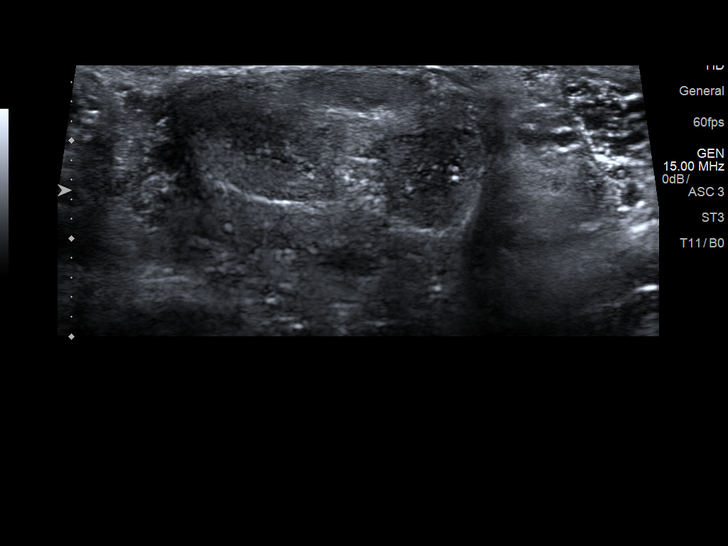
[im 62/68]
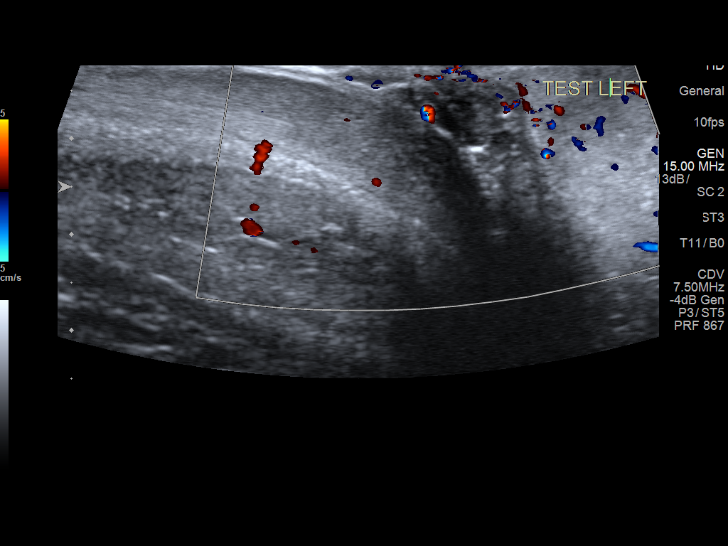
[im 68/68]
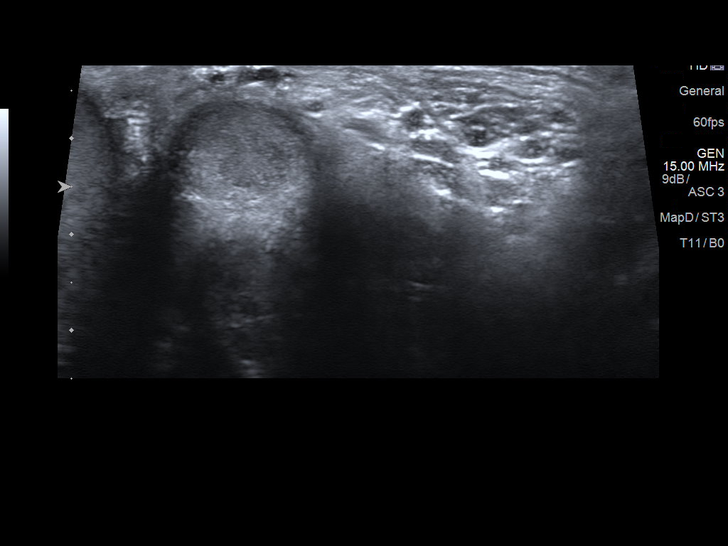

[14 of 25 positions shown; findings below may reference images not displayed]

FINDINGS: Right testicle

Measurements: 5.7 x 2.5 x 3.7 cm. No mass or microlithiasis
visualized.

Left testicle

Measurements: 5.4 x 2.1 x 3.5 cm. No mass or microlithiasis
visualized.

Right epididymis:  Normal in size and appearance.

Left epididymis:  Normal in size and appearance.

Hydrocele:  None visualized.

Varicocele:  None visualized.

Pulsed Doppler interrogation of both testes demonstrates normal low
resistance arterial and venous waveforms bilaterally.
IMPRESSION: 1. Negative for testicular torsion or intratesticular mass lesion.
2. No cystic or solid mass to correspond to palpable, painful area
located at the left lateral base of penis.

## 2022-08-28 ENCOUNTER — Ambulatory Visit: Payer: BC Managed Care – PPO | Admitting: Neurology

## 2022-09-27 ENCOUNTER — Ambulatory Visit: Payer: BC Managed Care – PPO | Admitting: Neurology

## 2022-09-28 ENCOUNTER — Encounter: Payer: Self-pay | Admitting: Neurology

## 2022-09-28 ENCOUNTER — Ambulatory Visit (INDEPENDENT_AMBULATORY_CARE_PROVIDER_SITE_OTHER): Payer: BC Managed Care – PPO | Admitting: Neurology

## 2022-09-28 VITALS — BP 144/84 | HR 65 | Ht 67.0 in | Wt 182.0 lb

## 2022-09-28 DIAGNOSIS — R29818 Other symptoms and signs involving the nervous system: Secondary | ICD-10-CM | POA: Diagnosis not present

## 2022-09-28 DIAGNOSIS — H539 Unspecified visual disturbance: Secondary | ICD-10-CM | POA: Diagnosis not present

## 2022-09-28 NOTE — Patient Instructions (Signed)
MRI Brain with and without contrast  If normal, then we will treat patient as migraine aura without headaches, will start him on Topiramate.  Follow up in 6 months or sooner if worse

## 2022-09-28 NOTE — Progress Notes (Signed)
[facility]  PATIENT: Antonio Hayes DOB: [birth year]  REQUESTING CLINICIAN: Elwin Mocha* HISTORY FROM: Patient  REASON FOR VISIT: Visual phenomenon    HISTORICAL  CHIEF COMPLAINT:  Chief Complaint  Patient presents with   New Patient (Initial Visit)    Rm 13. Patient alone, Reports seeing bright colors around bright lights and was cleared from eye doctor.     HISTORY OF PRESENT ILLNESS:  This is a 27 year old gentleman with no reported past medical history who is presenting with complaint of visual phenomenon.  He reports since the age of 17 he was having visual phenomenon describes as seeing sparkles of light or flashes of light when looking at the blue sky.  When looking at the wall he will see like snow on the wall.  This started at the age of 19 and now is getting worse to the point that he will have 6 days of event in a week.  He denies any headache associated with this phenomenon.  Denies any other deficit associated with it, no loss of vision, no double vision, no blurry vision, no focal deficits.  He did have an ophthalmology evaluation and eye exam was normal.  No other complaints.  He reports that mother and grandfather have a history of migraines.  He does have occasional headaches but never diagnosed with migraines. Does not take any prescribed medications.     OTHER MEDICAL CONDITIONS: None reported    REVIEW OF SYSTEMS: Full 14 system review of systems performed and negative with exception of: As noted in the HPI   ALLERGIES: Allergies  Allergen Reactions   Levofloxacin Other (See Comments)    Other Reaction(s): Other    neuropathy  neuropathy, , Other Reaction(s): Other  neuropathy  neuropathy    Other Reaction(s): Other  neuropathy  neuropathy   Percocet [Oxycodone-Acetaminophen] Other (See Comments)    PT STATES IT PUTS HIM IN A DEPRESSION    HOME MEDICATIONS: Outpatient Medications Prior to Visit  Medication Sig  Dispense Refill   albuterol (VENTOLIN HFA) 108 (90 Base) MCG/ACT inhaler Inhale 2 puffs into the lungs as needed for shortness of breath.     ibuprofen (ADVIL,MOTRIN) 200 MG tablet Take 400-600 mg by mouth every 6 (six) hours as needed for headache or moderate pain.     multivitamin (ONE-A-DAY MEN'S) TABS tablet Take 1 tablet by mouth 4 (four) times a week.     No facility-administered medications prior to visit.    PAST MEDICAL HISTORY: Past Medical History:  Diagnosis Date   Anxiety    NO MEDS   Asthma    Depression    NO MEDS   Heart murmur    Leaky heart valve    ASYMPTOMATIC    PAST SURGICAL HISTORY: Past Surgical History:  Procedure Laterality Date   right ring finger     SCROTAL EXPLORATION N/A 12/28/2017   Procedure: SCROTUM EXPLORATION;  Surgeon: Sondra Come, MD;  Location: [facility] ORS;  Service: Urology;  Laterality: N/A;   URETHRAL CYST REMOVAL N/A 12/28/2017   Procedure: EXCISION SCROTAL CYST;  Surgeon: Sondra Come, MD;  Location: [facility] ORS;  Service: Urology;  Laterality: N/A;    FAMILY HISTORY: Family History  Problem Relation Age of Onset   Prostate cancer Paternal Grandfather     SOCIAL HISTORY: Social History   Socioeconomic History   Marital status: Single    Spouse name: Not on file   Number of children: Not on file   Years  of education: Not on file   Highest education level: Not on file  Occupational History   Not on file  Tobacco Use   Smoking status: Never   Smokeless tobacco: Never  Vaping Use   Vaping status: Every Day   Start date: 12/27/2015   Substances: Nicotine  Substance and Sexual Activity   Alcohol use: Yes    Comment: RARE    Drug use: Yes    Types: Marijuana    Comment: EVERYDAY   Sexual activity: Not on file  Other Topics Concern   Not on file  Social History Narrative   Not on file   Social Determinants of Health   Financial Resource Strain: Low Risk  (03/30/2022)   Received from [facility], [facility]    Overall Financial Resource Strain (CARDIA)    Difficulty of Paying Living Expenses: Not hard at all  Food Insecurity: No Food Insecurity (03/30/2022)   Received [facility], [facility]   Hunger Vital Sign    Worried About Running Out of Food in the Last Year: Never true    Ran Out of Food in the Last Year: Never true  Transportation Needs: No Transportation Needs (03/30/2022)   Received from [facility], [facility]   PRAPARE - Transportation    Lack of Transportation (Medical): No    Lack of Transportation (Non-Medical): No  Physical Activity: Insufficiently Active (03/30/2022)   Received from [facility], [facility]   Exercise Vital Sign    Days of Exercise per Week: 7 days    Minutes of Exercise per Session: 20 min  Stress: Stress Concern Present (03/30/2022)   Received from [facility], [facility] - Occupational Stress Questionnaire    Feeling of Stress : Rather much  Social Connections: Somewhat Isolated (03/30/2022)   Received from [facility], [facility]   Social Network    How would you rate your social network (family, work, friends)?: Restricted participation with some degree of social isolation  Intimate Partner Violence: Not At Risk (03/30/2022)   Received from [facility], [facility]   HITS    Over the last 12 months how often did your partner physically hurt you?: 1    Over the last 12 months how often did your partner insult you or talk down to you?: 1    Over the last 12 months how often did your partner threaten you with physical harm?: 1    Over the last 12 months how often did your partner scream or curse at you?: 1    PHYSICAL EXAM  GENERAL EXAM/CONSTITUTIONAL: Vitals:  Vitals:   09/28/22 0901  BP: (!) 144/84  Pulse: 65  Weight: 82.6 kg  Height: 5\' 7"  (1.702 m)   Body mass index is 28.51 kg/m. Wt Readings from Last 3 Encounters:  09/28/22 82.6 kg  01/30/18 71.3 kg  12/28/17 72.6  kg   Patient is in no distress; well developed, nourished and groomed; neck is supple  MUSCULOSKELETAL: Gait, strength, tone, movements noted in Neurologic exam below  NEUROLOGIC: MENTAL STATUS:      No data to display         awake, alert, oriented to person, place and time recent and remote memory intact normal attention and concentration language fluent, comprehension intact, naming intact fund of knowledge appropriate  CRANIAL NERVE:  2nd - no papilledema or hemorrhages on fundoscopic exam 2nd, 3rd, 4th, 6th - pupils equal and reactive to light, visual  fields full to confrontation, extraocular muscles intact, no nystagmus 5th - facial sensation symmetric 7th - facial strength symmetric 8th - hearing intact 9th - palate elevates symmetrically, uvula midline 11th - shoulder shrug symmetric 12th - tongue protrusion midline  MOTOR:  normal bulk and tone, full strength in the BUE, BLE  SENSORY:  normal and symmetric to light touch, pinprick  COORDINATION:  finger-nose-finger, fine finger movements normal  GAIT/STATION:  normal   DIAGNOSTIC DATA (LABS, IMAGING, TESTING) - I reviewed patient records, labs, notes, testing and imaging myself where available.  No results found for: "WBC", "HGB", "HCT", "MCV", "PLT" No results found for: "NA", "K", "CL", "CO2", "GLUCOSE", "BUN", "CREATININE", "CALCIUM", "PROT", "ALBUMIN", "AST", "ALT", "ALKPHOS", "BILITOT", "GFRNONAA", "GFRAA" No results found for: "CHOL", "HDL", "LDLCALC", "LDLDIRECT", "TRIG", "CHOLHDL" No results found for: "HGBA1C" No results found for: "VITAMINB12" No results found for: "TSH"    ASSESSMENT AND PLAN  27 y.o. year old male with no reported past medical history who is presenting with visual phenomenon and deficit that has been getting worse lately to now 6 days/week.  These visual phenomenon are not associated with headaches.  Plan is to rule out structural abnormality with MRI brain with and  without contrast and if negative then we will treat these as migraine auras without headache.  I will contact the patient to go over the result of the MRI otherwise I will see him in 6 months for follow-up or sooner if worse.   1. Visual aura   2. Neurological deficit present     Patient Instructions  MRI Brain with and without contrast  If normal, then we will treat patient as migraine aura without headaches, will start him on Topiramate.  Follow up in 6 months or sooner if worse    Orders Placed This Encounter  Procedures   MR BRAIN W WO CONTRAST    No orders of the defined types were placed in this encounter.   Return in about 6 months (around 03/31/2023).    Windell Norfolk, MD 09/28/2022, 9:33 AM  [facility] [address] [place], [place] [address] [phone]

## 2022-10-03 ENCOUNTER — Telehealth: Payer: Self-pay | Admitting: Neurology

## 2022-10-03 NOTE — Telephone Encounter (Signed)
Pt scheduled for 45 mins MR brain w/wo contrast at [facility] for 10/04/22 at 10am  [employer] auth# [id number] (10/02/22-10/31/22)

## 2022-10-04 ENCOUNTER — Other Ambulatory Visit: Payer: Self-pay | Admitting: Neurology

## 2022-10-04 ENCOUNTER — Ambulatory Visit (INDEPENDENT_AMBULATORY_CARE_PROVIDER_SITE_OTHER): Payer: BC Managed Care – PPO

## 2022-10-04 DIAGNOSIS — H539 Unspecified visual disturbance: Secondary | ICD-10-CM

## 2022-10-04 MED ORDER — TOPIRAMATE 50 MG PO TABS: 50.0000 mg | ORAL_TABLET | Freq: Two times a day (BID) | ORAL | 6 refills | Status: AC

## 2022-10-04 MED ORDER — GADOBENATE DIMEGLUMINE 529 MG/ML IV SOLN
15.0000 mL | Freq: Once | INTRAVENOUS | Status: AC | PRN
  Administered 2022-10-04: 15 mL via INTRAVENOUS

## 2023-04-09 ENCOUNTER — Telehealth: Payer: Self-pay | Admitting: Neurology

## 2023-04-09 NOTE — Telephone Encounter (Signed)
 request to cancel appointment

## 2023-04-10 ENCOUNTER — Ambulatory Visit: Payer: BC Managed Care – PPO | Admitting: Neurology
